# Patient Record
Sex: Female | Born: 1965
Health system: Southern US, Community
[De-identification: ages and names within clinical notes are randomized; demographics above are authoritative.]

## PROBLEM LIST (undated history)

## (undated) DIAGNOSIS — F419 Anxiety disorder, unspecified: Secondary | ICD-10-CM

## (undated) HISTORY — PX: TRIGGER FINGER RELEASE: SHX641

## (undated) HISTORY — PX: DILATION AND CURETTAGE OF UTERUS: SHX78

---

## 1999-04-22 ENCOUNTER — Other Ambulatory Visit: Admission: RE | Admit: 1999-04-22 | Discharge: 1999-04-22 | Payer: Self-pay | Admitting: Obstetrics and Gynecology

## 2000-04-22 ENCOUNTER — Other Ambulatory Visit: Admission: RE | Admit: 2000-04-22 | Discharge: 2000-04-22 | Payer: Self-pay | Admitting: Obstetrics and Gynecology

## 2001-09-06 ENCOUNTER — Other Ambulatory Visit: Admission: RE | Admit: 2001-09-06 | Discharge: 2001-09-06 | Payer: Self-pay | Admitting: Obstetrics and Gynecology

## 2003-02-06 ENCOUNTER — Ambulatory Visit (HOSPITAL_COMMUNITY): Admission: RE | Admit: 2003-02-06 | Discharge: 2003-02-06 | Payer: Self-pay | Admitting: Family Medicine

## 2003-02-06 ENCOUNTER — Encounter: Payer: Self-pay | Admitting: Family Medicine

## 2003-11-21 ENCOUNTER — Other Ambulatory Visit: Admission: RE | Admit: 2003-11-21 | Discharge: 2003-11-21 | Payer: Self-pay | Admitting: Obstetrics and Gynecology

## 2005-07-09 ENCOUNTER — Other Ambulatory Visit: Admission: RE | Admit: 2005-07-09 | Discharge: 2005-07-09 | Payer: Self-pay | Admitting: Obstetrics and Gynecology

## 2006-09-05 ENCOUNTER — Emergency Department (HOSPITAL_COMMUNITY): Admission: EM | Admit: 2006-09-05 | Discharge: 2006-09-05 | Payer: Self-pay | Admitting: Emergency Medicine

## 2018-09-08 DIAGNOSIS — H61002 Unspecified perichondritis of left external ear: Secondary | ICD-10-CM | POA: Diagnosis not present

## 2018-09-08 DIAGNOSIS — H9202 Otalgia, left ear: Secondary | ICD-10-CM | POA: Diagnosis not present

## 2018-09-08 DIAGNOSIS — H6692 Otitis media, unspecified, left ear: Secondary | ICD-10-CM | POA: Diagnosis not present

## 2018-11-06 DIAGNOSIS — Z6826 Body mass index (BMI) 26.0-26.9, adult: Secondary | ICD-10-CM | POA: Diagnosis not present

## 2018-11-06 DIAGNOSIS — Z1389 Encounter for screening for other disorder: Secondary | ICD-10-CM | POA: Diagnosis not present

## 2018-11-06 DIAGNOSIS — F418 Other specified anxiety disorders: Secondary | ICD-10-CM | POA: Diagnosis not present

## 2018-11-06 DIAGNOSIS — E663 Overweight: Secondary | ICD-10-CM | POA: Diagnosis not present

## 2018-11-06 DIAGNOSIS — Z0000 Encounter for general adult medical examination without abnormal findings: Secondary | ICD-10-CM | POA: Diagnosis not present

## 2018-11-06 DIAGNOSIS — R002 Palpitations: Secondary | ICD-10-CM | POA: Diagnosis not present

## 2018-11-06 DIAGNOSIS — Z719 Counseling, unspecified: Secondary | ICD-10-CM | POA: Diagnosis not present

## 2018-11-06 DIAGNOSIS — L570 Actinic keratosis: Secondary | ICD-10-CM | POA: Diagnosis not present

## 2018-11-16 ENCOUNTER — Encounter: Payer: Self-pay | Admitting: Internal Medicine

## 2018-11-24 ENCOUNTER — Encounter (INDEPENDENT_AMBULATORY_CARE_PROVIDER_SITE_OTHER): Payer: Self-pay | Admitting: *Deleted

## 2018-12-02 ENCOUNTER — Emergency Department (HOSPITAL_COMMUNITY)
Admission: EM | Admit: 2018-12-02 | Discharge: 2018-12-02 | Disposition: A | Discharge status: Home / Self Care | Payer: 59 | Attending: Emergency Medicine | Admitting: Emergency Medicine

## 2018-12-02 ENCOUNTER — Other Ambulatory Visit: Payer: Self-pay

## 2018-12-02 ENCOUNTER — Encounter (HOSPITAL_COMMUNITY): Payer: Self-pay | Admitting: Emergency Medicine

## 2018-12-02 ENCOUNTER — Emergency Department (HOSPITAL_COMMUNITY): Payer: 59

## 2018-12-02 DIAGNOSIS — F17210 Nicotine dependence, cigarettes, uncomplicated: Secondary | ICD-10-CM | POA: Insufficient documentation

## 2018-12-02 DIAGNOSIS — J189 Pneumonia, unspecified organism: Secondary | ICD-10-CM

## 2018-12-02 DIAGNOSIS — R05 Cough: Secondary | ICD-10-CM | POA: Diagnosis not present

## 2018-12-02 DIAGNOSIS — R0602 Shortness of breath: Secondary | ICD-10-CM | POA: Diagnosis not present

## 2018-12-02 DIAGNOSIS — J181 Lobar pneumonia, unspecified organism: Secondary | ICD-10-CM | POA: Insufficient documentation

## 2018-12-02 MED ORDER — ALBUTEROL SULFATE HFA 108 (90 BASE) MCG/ACT IN AERS
2.0000 | INHALATION_SPRAY | Freq: Once | RESPIRATORY_TRACT | Status: AC
  Administered 2018-12-02: 2 via RESPIRATORY_TRACT
  Filled 2018-12-02: qty 6.7

## 2018-12-02 MED ORDER — CEFTRIAXONE SODIUM 1 G IJ SOLR
1.0000 g | Freq: Once | INTRAMUSCULAR | Status: AC
  Administered 2018-12-02: 1 g via INTRAMUSCULAR
  Filled 2018-12-02: qty 10

## 2018-12-02 MED ORDER — AZITHROMYCIN 250 MG PO TABS: ORAL_TABLET | ORAL | 0 refills | Status: DC

## 2018-12-02 MED ORDER — LIDOCAINE HCL (PF) 1 % IJ SOLN
INTRAMUSCULAR | Status: AC
  Administered 2018-12-02: 2 mL
  Filled 2018-12-02: qty 2

## 2018-12-02 MED ORDER — PREDNISONE 20 MG PO TABS: 40.0000 mg | ORAL_TABLET | Freq: Every day | ORAL | 0 refills | Status: DC

## 2018-12-02 NOTE — Discharge Instructions (Addendum)
1 to 2 puffs of the inhaler every 4-6 hours as needed.  Take the antibiotic as directed until its finished.  You may start the Zithromax this evening.  Take over-the-counter Tylenol or ibuprofen if needed for body aches and/or fever.  Drink plenty of fluids.  Over-the-counter Mucinex or Robitussin as directed if needed for cough.  You will need to follow-up with your primary doctor for recheck in 1 week.  Return to the ER for any worsening symptoms.

## 2018-12-02 NOTE — ED Triage Notes (Signed)
Pt reports cough, generalized body aches, and subjective fever since Thursday. Has been having some improvement with OTC medications.

## 2018-12-05 NOTE — ED Provider Notes (Signed)
[facility] EMERGENCY DEPARTMENT Provider Note   CSN: [id number] Arrival date & time: 12/02/18  1442    History   Chief Complaint Chief Complaint  Patient presents with  . Cough    HPI Sheila Lawrence is a 53 y.o. female.     HPI   Sheila Lawrence is a 53 y.o. female who presents to the Emergency Department complaining of generalized body aches, fever, and cough.  Symptoms present for several days.  Cough has been mostly productive and occasional.  Fever at home is subjective.  She reports some improvement with OTC cold and cough medications. No shortness of breath, chest pain, abdominal pain, dysuria or vomiting.       History reviewed. No pertinent past medical history.  There are no active problems to display for this patient.   History reviewed. No pertinent surgical history.   OB History   No obstetric history on file.      Home Medications    Prior to Admission medications   Medication Sig Start Date End Date Taking? Authorizing Provider  azithromycin (ZITHROMAX) 250 MG tablet Take first 2 tablets together, then 1 every day until finished. 12/02/18   Judithe Keetch, PA-C  predniSONE (DELTASONE) 20 MG tablet Take 2 tablets (40 mg total) by mouth daily. 12/02/18   Kem Parkinson, PA-C    Family History History reviewed. No pertinent family history.  Social History Social History   Tobacco Use  . Smoking status: Current Every Day Smoker    Packs/day: 1.00    Types: Cigarettes  . Smokeless tobacco: Never Used  Substance Use Topics  . Alcohol use: Not Currently  . Drug use: Not Currently     Allergies   Patient has no known allergies.   Review of Systems Review of Systems  Constitutional: Positive for fever. Negative for activity change, appetite change and chills.  HENT: Positive for congestion and rhinorrhea. Negative for facial swelling, sore throat and trouble swallowing.   Eyes: Negative for visual disturbance.  Respiratory: Positive  for cough. Negative for chest tightness, shortness of breath, wheezing and stridor.   Gastrointestinal: Negative for abdominal pain, nausea and vomiting.  Genitourinary: Negative for decreased urine volume and dysuria.  Musculoskeletal: Positive for myalgias. Negative for neck pain and neck stiffness.  Skin: Negative for rash.  Neurological: Negative for dizziness, weakness, numbness and headaches.  Hematological: Negative for adenopathy.  Psychiatric/Behavioral: Negative for confusion.     Physical Exam Updated Vital Signs BP 110/79 (BP Location: Left Arm)   Pulse 98   Temp 99.1 F (37.3 C) (Oral)   Resp 16   Ht 5\' 6"  (1.676 m)   Wt 72.6 kg   SpO2 95%   BMI 25.82 kg/m   Physical Exam Vitals signs and nursing note reviewed.  Constitutional:      General: She is not in acute distress.    Appearance: She is well-developed.  HENT:     Head: Normocephalic and atraumatic.     Right Ear: Tympanic membrane and ear canal normal.     Left Ear: Tympanic membrane and ear canal normal.     Mouth/Throat:     Pharynx: Uvula midline. No oropharyngeal exudate.  Eyes:     Pupils: Pupils are equal, round, and reactive to light.  Neck:     Musculoskeletal: Full passive range of motion without pain and normal range of motion.     Trachea: Phonation normal.  Cardiovascular:     Rate and Rhythm: Normal rate  and regular rhythm.     Pulses: Normal pulses.     Heart sounds: No murmur.  Pulmonary:     Effort: Pulmonary effort is normal. No respiratory distress.     Breath sounds: No stridor. No wheezing or rales.     Comments: Expiratory wheezes, no rales Chest:     Chest wall: No tenderness.  Abdominal:     Palpations: Abdomen is soft.     Tenderness: There is no abdominal tenderness.  Musculoskeletal:        General: No tenderness.     Right lower leg: No edema.     Left lower leg: No edema.  Lymphadenopathy:     Cervical: No cervical adenopathy.  Skin:    General: Skin is warm  and dry.     Capillary Refill: Capillary refill takes less than 2 seconds.  Neurological:     Mental Status: She is alert. Mental status is at baseline.     Sensory: No sensory deficit.     Motor: No weakness or abnormal muscle tone.      ED Treatments / Results  Labs (all labs ordered are listed, but only abnormal results are displayed) Labs Reviewed - No data to display  EKG None  Radiology No results found.  Procedures Procedures (including critical care time)  Medications Ordered in ED Medications  cefTRIAXone (ROCEPHIN) injection 1 g (1 g Intramuscular Given 12/02/18 1703)  albuterol (PROVENTIL HFA;VENTOLIN HFA) 108 (90 Base) MCG/ACT inhaler 2 puff (2 puffs Inhalation Given 12/02/18 1700)  lidocaine (PF) (XYLOCAINE) 1 % injection (2 mLs  Given 12/02/18 1703)     Initial Impression / Assessment and Plan / ED Course  I have reviewed the triage vital signs and the nursing notes.  Pertinent labs & imaging results that were available during my care of the patient were reviewed by me and considered in my medical decision making (see chart for details).        Pt with cough for several days.  Vitals reassuring.  No hypoxia, tachycardia or tachypnea.  CXR shows RML PNA.   On recheck, lung sounds improved after albuterol neb, MDI dispensed.  IM rocephin also given and pt agrees to rx for zithromax and close out pt f/u to ensure resolution.  She appears appropriate for d/c home and strict return precautions discussed.   Final Clinical Impressions(s) / ED Diagnoses   Final diagnoses:  Community acquired pneumonia of right middle lobe of lung [facility])    ED Discharge Orders         Ordered    azithromycin (ZITHROMAX) 250 MG tablet     12/02/18 1708    predniSONE (DELTASONE) 20 MG tablet  Daily     12/02/18 1708           Kem Parkinson, PA-C 12/05/18 2318    Nat Christen, MD 12/06/18 [phone]

## 2019-01-23 ENCOUNTER — Encounter (INDEPENDENT_AMBULATORY_CARE_PROVIDER_SITE_OTHER): Payer: Self-pay | Admitting: *Deleted

## 2019-01-24 ENCOUNTER — Ambulatory Visit: Payer: Self-pay | Admitting: Nurse Practitioner

## 2019-06-04 ENCOUNTER — Other Ambulatory Visit (INDEPENDENT_AMBULATORY_CARE_PROVIDER_SITE_OTHER): Payer: Self-pay | Admitting: *Deleted

## 2019-06-04 DIAGNOSIS — Z1211 Encounter for screening for malignant neoplasm of colon: Secondary | ICD-10-CM | POA: Insufficient documentation

## 2019-07-17 ENCOUNTER — Telehealth (INDEPENDENT_AMBULATORY_CARE_PROVIDER_SITE_OTHER): Payer: Self-pay | Admitting: *Deleted

## 2019-07-17 ENCOUNTER — Encounter (INDEPENDENT_AMBULATORY_CARE_PROVIDER_SITE_OTHER): Payer: Self-pay | Admitting: *Deleted

## 2019-07-17 DIAGNOSIS — Z1211 Encounter for screening for malignant neoplasm of colon: Secondary | ICD-10-CM

## 2019-07-17 MED ORDER — SUPREP BOWEL PREP KIT 17.5-3.13-1.6 GM/177ML PO SOLN: 1.0000 | Freq: Once | ORAL | 0 refills | Status: AC

## 2019-07-17 NOTE — Telephone Encounter (Signed)
Patient needs suprep TCS sch'd 11/11

## 2019-07-25 ENCOUNTER — Ambulatory Visit (INDEPENDENT_AMBULATORY_CARE_PROVIDER_SITE_OTHER): Payer: Self-pay

## 2019-07-25 ENCOUNTER — Other Ambulatory Visit (INDEPENDENT_AMBULATORY_CARE_PROVIDER_SITE_OTHER): Payer: Self-pay | Admitting: *Deleted

## 2019-07-25 ENCOUNTER — Other Ambulatory Visit: Payer: Self-pay

## 2019-07-25 ENCOUNTER — Telehealth (INDEPENDENT_AMBULATORY_CARE_PROVIDER_SITE_OTHER): Payer: Self-pay | Admitting: *Deleted

## 2019-07-25 NOTE — Telephone Encounter (Signed)
Referring MD/PCP: golding   Procedure: tcs  Reason/Indication:  screening  Has patient had this procedure before?  no  If so, when, by whom and where?    Is there a family history of colon cancer?  no  Who?  What age when diagnosed?    Is patient diabetic?   no      Does patient have prosthetic heart valve or mechanical valve?  no  Do you have a pacemaker/defibrillator?  no  Has patient ever had endocarditis/atrial fibrillation? no  Does patient use oxygen? no  Has patient had joint replacement within last 12 months?  no  Is patient constipated or do they take laxatives? no  Does patient have a history of alcohol/drug use?  no  Is patient on blood thinner such as Coumadin, Plavix and/or Aspirin? no  Medications: rosuvstatin 5 mg daily  Allergies: nkda  Medication Adjustment per Dr Charlena Cross, NP:   Procedure date & time: 08/22/19 at 1030

## 2019-08-20 ENCOUNTER — Other Ambulatory Visit: Payer: Self-pay

## 2019-08-20 ENCOUNTER — Other Ambulatory Visit (HOSPITAL_COMMUNITY)
Admission: RE | Admit: 2019-08-20 | Discharge: 2019-08-20 | Disposition: A | Discharge status: Home / Self Care | Payer: 59 | Source: Ambulatory Visit | Attending: Internal Medicine | Admitting: Internal Medicine

## 2019-08-21 ENCOUNTER — Other Ambulatory Visit: Payer: Self-pay

## 2019-08-21 ENCOUNTER — Other Ambulatory Visit (HOSPITAL_COMMUNITY)
Admission: RE | Admit: 2019-08-21 | Discharge: 2019-08-21 | Disposition: A | Discharge status: Home / Self Care | Payer: 59 | Source: Ambulatory Visit | Attending: Internal Medicine | Admitting: Internal Medicine

## 2019-08-21 DIAGNOSIS — Z20828 Contact with and (suspected) exposure to other viral communicable diseases: Secondary | ICD-10-CM | POA: Diagnosis not present

## 2019-08-21 DIAGNOSIS — Z01812 Encounter for preprocedural laboratory examination: Secondary | ICD-10-CM | POA: Diagnosis not present

## 2019-08-21 LAB — SARS CORONAVIRUS 2 (TAT 6-24 HRS): SARS Coronavirus 2: NEGATIVE

## 2019-08-22 ENCOUNTER — Other Ambulatory Visit: Payer: Self-pay

## 2019-08-22 ENCOUNTER — Encounter (HOSPITAL_COMMUNITY): Payer: Self-pay | Admitting: *Deleted

## 2019-08-22 ENCOUNTER — Encounter (HOSPITAL_COMMUNITY)
Admission: RE | Disposition: A | Discharge status: Home / Self Care | Payer: Self-pay | Source: Home / Self Care | Attending: Internal Medicine

## 2019-08-22 ENCOUNTER — Ambulatory Visit (HOSPITAL_COMMUNITY)
Admission: RE | Admit: 2019-08-22 | Discharge: 2019-08-22 | Disposition: A | Discharge status: Home / Self Care | Payer: 59 | Attending: Internal Medicine | Admitting: Internal Medicine

## 2019-08-22 DIAGNOSIS — Z8371 Family history of colonic polyps: Secondary | ICD-10-CM | POA: Insufficient documentation

## 2019-08-22 DIAGNOSIS — Z1211 Encounter for screening for malignant neoplasm of colon: Secondary | ICD-10-CM | POA: Insufficient documentation

## 2019-08-22 DIAGNOSIS — F419 Anxiety disorder, unspecified: Secondary | ICD-10-CM | POA: Diagnosis not present

## 2019-08-22 DIAGNOSIS — Z79899 Other long term (current) drug therapy: Secondary | ICD-10-CM | POA: Diagnosis not present

## 2019-08-22 DIAGNOSIS — D128 Benign neoplasm of rectum: Secondary | ICD-10-CM | POA: Insufficient documentation

## 2019-08-22 DIAGNOSIS — F17210 Nicotine dependence, cigarettes, uncomplicated: Secondary | ICD-10-CM | POA: Insufficient documentation

## 2019-08-22 DIAGNOSIS — K621 Rectal polyp: Secondary | ICD-10-CM | POA: Diagnosis not present

## 2019-08-22 HISTORY — PX: POLYPECTOMY: SHX5525

## 2019-08-22 HISTORY — PX: COLONOSCOPY: SHX5424

## 2019-08-22 HISTORY — DX: Anxiety disorder, unspecified: F41.9

## 2019-08-22 SURGERY — COLONOSCOPY
Anesthesia: Moderate Sedation

## 2019-08-22 MED ORDER — MEPERIDINE HCL 50 MG/ML IJ SOLN
INTRAMUSCULAR | Status: DC | PRN
  Administered 2019-08-22 (×2): 25 mg

## 2019-08-22 MED ORDER — MIDAZOLAM HCL 5 MG/5ML IJ SOLN
INTRAMUSCULAR | Status: AC
  Filled 2019-08-22: qty 10

## 2019-08-22 MED ORDER — STERILE WATER FOR IRRIGATION IR SOLN
Status: DC | PRN
  Administered 2019-08-22: 11:00:00 1.5 mL

## 2019-08-22 MED ORDER — MEPERIDINE HCL 50 MG/ML IJ SOLN
INTRAMUSCULAR | Status: AC
  Filled 2019-08-22: qty 1

## 2019-08-22 MED ORDER — SODIUM CHLORIDE 0.9 % IV SOLN
INTRAVENOUS | Status: DC
  Administered 2019-08-22: 1000 mL via INTRAVENOUS

## 2019-08-22 MED ORDER — MIDAZOLAM HCL 5 MG/5ML IJ SOLN
INTRAMUSCULAR | Status: DC | PRN
  Administered 2019-08-22: 2 mg via INTRAVENOUS
  Administered 2019-08-22 (×2): 1 mg via INTRAVENOUS
  Administered 2019-08-22: 2 mg via INTRAVENOUS

## 2019-08-22 NOTE — Discharge Instructions (Signed)
No aspirin or NSAIDs for 48 hours. Resume usual medications and diet as before. No driving for 24 hours. Physician  will call with biopsy results.     Colonoscopy, Adult, Care After This sheet gives you information about how to care for yourself after your procedure. Your doctor may also give you more specific instructions. If you have problems or questions, call your doctor. What can I expect after the procedure? After the procedure, it is common to have:  A small amount of blood in your poop for 24 hours.  Some gas.  Mild cramping or bloating in your belly. Follow these instructions at home: General instructions  For the first 24 hours after the procedure: ? Do not drive or use machinery. ? Do not sign important documents. ? Do not drink alcohol. ? Do your daily activities more slowly than normal. ? Eat foods that are soft and easy to digest.  Take over-the-counter or prescription medicines only as told by your doctor. To help cramping and bloating:   Try walking around.  Put heat on your belly (abdomen) as told by your doctor. Use a heat source that your doctor recommends, such as a moist heat pack or a heating pad. ? Put a towel between your skin and the heat source. ? Leave the heat on for 20-30 minutes. ? Remove the heat if your skin turns bright red. This is especially important if you cannot feel pain, heat, or cold. You can get burned. Eating and drinking   Drink enough fluid to keep your pee (urine) clear or pale yellow.  Return to your normal diet as told by your doctor. Avoid heavy or fried foods that are hard to digest.  Avoid drinking alcohol for as long as told by your doctor. Contact a doctor if:  You have blood in your poop (stool) 2-3 days after the procedure. Get help right away if:  You have more than a small amount of blood in your poop.  You see large clumps of tissue (blood clots) in your poop.  Your belly is swollen.  You feel sick to  your stomach (nauseous).  You throw up (vomit).  You have a fever.  You have belly pain that gets worse, and medicine does not help your pain. Summary  After the procedure, it is common to have a small amount of blood in your poop. You may also have mild cramping and bloating in your belly.  For the first 24 hours after the procedure, do not drive or use machinery, do not sign important documents, and do not drink alcohol.  Get help right away if you have a lot of blood in your poop, feel sick to your stomach, have a fever, or have more belly pain. This information is not intended to replace advice given to you by your health care provider. Make sure you discuss any questions you have with your health care provider. Document Released: 10/30/2010 Document Revised: 07/28/2017 Document Reviewed: 06/21/2016 Elsevier Patient Education  2020 [employer].    Colon Polyps  Polyps are tissue growths inside the body. Polyps can grow in many places, including the large intestine (colon). A polyp may be a round bump or a mushroom-shaped growth. You could have one polyp or several. Most colon polyps are noncancerous (benign). However, some colon polyps can become cancerous over time. Finding and removing the polyps early can help prevent this. What are the causes? The exact cause of colon polyps is not known. What increases the  risk? You are more likely to develop this condition if you:  Have a family history of colon cancer or colon polyps.  Are older than 61 or older than 45 if you are African American.  Have inflammatory bowel disease, such as ulcerative colitis or Crohn's disease.  Have certain hereditary conditions, such as: ? Familial adenomatous polyposis. ? Lynch syndrome. ? Turcot syndrome. ? Peutz-Jeghers syndrome.  Are overweight.  Smoke cigarettes.  Do not get enough exercise.  Drink too much alcohol.  Eat a diet that is high in fat and red meat and low in  fiber.  Had childhood cancer that was treated with abdominal radiation. What are the signs or symptoms? Most polyps do not cause symptoms. If you have symptoms, they may include:  Blood coming from your rectum when having a bowel movement.  Blood in your stool. The stool may look dark red or black.  Abdominal pain.  A change in bowel habits, such as constipation or diarrhea. How is this diagnosed? This condition is diagnosed with a colonoscopy. This is a procedure in which a lighted, flexible scope is inserted into the anus and then passed into the colon to examine the area. Polyps are sometimes found when a colonoscopy is done as part of routine cancer screening tests. How is this treated? Treatment for this condition involves removing any polyps that are found. Most polyps can be removed during a colonoscopy. Those polyps will then be tested for cancer. Additional treatment may be needed depending on the results of testing. Follow these instructions at home: Lifestyle  Maintain a healthy weight, or lose weight if recommended by your health care provider.  Exercise every day or as told by your health care provider.  Do not use any products that contain nicotine or tobacco, such as cigarettes and e-cigarettes. If you need help quitting, ask your health care provider.  If you drink alcohol, limit how much you have: ? 0-1 drink a day for women. ? 0-2 drinks a day for men.  Be aware of how much alcohol is in your drink. In the [place] one drink equals one 12 oz bottle of beer (355 mL), one 5 oz glass of wine (148 mL), or one 1 oz shot of hard liquor (44 mL). Eating and drinking   Eat foods that are high in fiber, such as fruits, vegetables, and whole grains.  Eat foods that are high in calcium and vitamin D, such as milk, cheese, yogurt, eggs, liver, fish, and broccoli.  Limit foods that are high in fat, such as fried foods and desserts.  Limit the amount of red meat and  processed meat you eat, such as hot dogs, sausage, bacon, and lunch meats. General instructions  Keep all follow-up visits as told by your health care provider. This is important. ? This includes having regularly scheduled colonoscopies. ? Talk to your health care provider about when you need a colonoscopy. Contact a health care provider if:  You have new or worsening bleeding during a bowel movement.  You have new or increased blood in your stool.  You have a change in bowel habits.  You lose weight for no known reason. Summary  Polyps are tissue growths inside the body. Polyps can grow in many places, including the colon.  Most colon polyps are noncancerous (benign), but some can become cancerous over time.  This condition is diagnosed with a colonoscopy.  Treatment for this condition involves removing any polyps that are found. Most polyps can  be removed during a colonoscopy. This information is not intended to replace advice given to you by your health care provider. Make sure you discuss any questions you have with your health care provider. Document Released: 06/23/2004 Document Revised: 01/12/2018 Document Reviewed: 01/12/2018 Elsevier Patient Education  2020 [employer].

## 2019-08-22 NOTE — Op Note (Signed)
[facility] Patient Name: Sheila Lawrence Procedure Date: 08/22/2019 10:28 AM MRN: [id number] Date of Birth: 09/14/1966 Attending MD: Hildred Laser , MD CSN: [id number] Age: 53 Admit Type: Outpatient Procedure:                Colonoscopy Indications:              Screening for colorectal malignant neoplasm Providers:                Hildred Laser, MD, Gerome Sam, RN, Randa Spike, Technician Referring MD:             Halford Chessman, MD Medicines:                Meperidine 50 mg IV, Midazolam 5 mg IV Complications:            No immediate complications. Estimated Blood Loss:     Estimated blood loss was minimal. Procedure:                Pre-Anesthesia Assessment:                           - Prior to the procedure, a History and Physical                            was performed, and patient medications and                            allergies were reviewed. The patient's tolerance of                            previous anesthesia was also reviewed. The risks                            and benefits of the procedure and the sedation                            options and risks were discussed with the patient.                            All questions were answered, and informed consent                            was obtained. Prior Anticoagulants: The patient has                            taken no previous anticoagulant or antiplatelet                            agents. ASA Grade Assessment: I - A normal, healthy                            patient. After reviewing the risks and benefits,  the patient was deemed in satisfactory condition to                            undergo the procedure.                           After obtaining informed consent, the colonoscope                            was passed under direct vision. Throughout the                            procedure, the patient's blood pressure, pulse, and                   oxygen saturations were monitored continuously. The                            [id number]) was introduced through the                            anus and advanced to the the cecum, identified by                            appendiceal orifice and ileocecal valve. The                            colonoscopy was performed without difficulty. The                            patient tolerated the procedure well. The quality                            of the bowel preparation was good. The ileocecal                            valve, appendiceal orifice, and rectum were                            photographed. Scope In: 10:48:14 AM Scope Out: [id number] AM Scope Withdrawal Time: 0 hours 14 minutes 8 seconds  Total Procedure Duration: 0 hours 26 minutes 54 seconds  Findings:      The perianal and digital rectal examinations were normal.      The colon (entire examined portion) appeared normal.      A 5 mm polyp was found in the rectum. The polyp was removed with a cold       snare. Resection and retrieval were complete. The pathology specimen was       placed into Bottle Number 1.      A small polyp was found in the rectum. The polyp was sessile. The polyp       was removed with a cold snare. Resection and retrieval were complete.       The pathology specimen was placed into Bottle Number 1. Impression:               - The entire examined colon  is normal.                           - One 5 mm polyp in the rectum, removed with a cold                            snare. Resected and retrieved.                           - One small polyp in the rectum, removed with a                            cold snare. Resected and retrieved. Moderate Sedation:      Moderate (conscious) sedation was administered by the endoscopy nurse       and supervised by the endoscopist. The following parameters were       monitored: oxygen saturation, heart rate, blood pressure, CO2       capnography and  response to care. Total physician intraservice time was       33 minutes. Recommendation:           - Patient has a contact number available for                            emergencies. The signs and symptoms of potential                            delayed complications were discussed with the                            patient. Return to normal activities tomorrow.                            Written discharge instructions were provided to the                            patient.                           - Resume previous diet today.                           - Continue present medications.                           - No aspirin, ibuprofen, naproxen, or other                            non-steroidal anti-inflammatory drugs for 2 days.                           - Await pathology results.                           - Repeat colonoscopy is recommended. The  colonoscopy date will be determined after pathology                            results from today's exam become available for                            review. Procedure Code(s):        --- Professional ---                           [id number], Colonoscopy, flexible; with removal of                            tumor(s), polyp(s), or other lesion(s) by snare                            technique                           99153, Moderate sedation; each additional 15                            minutes intraservice time                           G0500, Moderate sedation services provided by the                            same physician or other qualified health care                            professional performing a gastrointestinal                            endoscopic service that sedation supports,                            requiring the presence of an independent trained                            observer to assist in the monitoring of the                            patient's level of consciousness and physiological                             status; initial 15 minutes of intra-service time;                            patient age 68 years or older (additional time may                            be reported with [phone], as appropriate) Diagnosis Code(s):        --- Professional ---  Z12.11, Encounter for screening for malignant                            neoplasm of colon                           K62.1, Rectal polyp CPT copyright 2019 [employer] Association. All rights reserved. The codes documented in this report are preliminary and upon coder review may  be revised to meet current compliance requirements. Hildred Laser, MD Hildred Laser, MD 08/22/2019 11:21:51 AM This report has been signed electronically. Number of Addenda: 0

## 2019-08-22 NOTE — H&P (Signed)
Sheila Lawrence is an 53 y.o. female.   Chief Complaint: Patient is here for colonoscopy. HPI: Patient is 53 year old Caucasian female who is here for screening colonoscopy.  She denies abdominal pain change in bowel habits or rectal bleeding.  This is patient's first exam. Patient states her father had a polyp removed from his colon at age 15 possibly with high-grade dysplasia.  He did not require any surgery.  He is now 53 years old and doing fine.  Past Medical History:  Diagnosis Date  . Anxiety    panic attacks    Past Surgical History:  Procedure Laterality Date  . DILATION AND CURETTAGE OF UTERUS    . TRIGGER FINGER RELEASE Right     Family History  Problem Relation Age of Onset  . Asthma Mother    Social History:  reports that she has been smoking cigarettes. She has been smoking about 1.00 pack per day. She has never used smokeless tobacco. She reports previous alcohol use. She reports previous drug use.  Allergies: No Known Allergies  Medications Prior to Admission  Medication Sig Dispense Refill  . Cholecalciferol (VITAMIN D3 PO) Take 1 tablet by mouth daily.    Marland Kitchen azithromycin (ZITHROMAX) 250 MG tablet Take first 2 tablets together, then 1 every day until finished. (Patient not taking: Reported on 08/14/2019) 6 tablet 0  . predniSONE (DELTASONE) 20 MG tablet Take 2 tablets (40 mg total) by mouth daily. (Patient not taking: Reported on 08/14/2019) 10 tablet 0    Results for orders placed or performed during the hospital encounter of 08/21/19 (from the past 48 hour(s))  SARS CORONAVIRUS 2 (TAT 6-24 HRS) Nasopharyngeal Nasopharyngeal Swab     Status: None   Collection Time: 08/21/19  7:15 AM   Specimen: Nasopharyngeal Swab  Result Value Ref Range   SARS Coronavirus 2 NEGATIVE NEGATIVE    Comment: (NOTE) SARS-CoV-2 target nucleic acids are NOT DETECTED. The SARS-CoV-2 RNA is generally detectable in upper and lower respiratory specimens during the acute phase of  infection. Negative results do not preclude SARS-CoV-2 infection, do not rule out co-infections with other pathogens, and should not be used as the sole basis for treatment or other patient management decisions. Negative results must be combined with clinical observations, patient history, and epidemiological information. The expected result is Negative. Fact Sheet for Patients: SugarRoll.be Fact Sheet for Healthcare Providers: https://www.woods-mathews.com/ This test is not yet approved or cleared by the [place] FDA and  has been authorized for detection and/or diagnosis of SARS-CoV-2 by FDA under an Emergency Use Authorization (EUA). This EUA will remain  in effect (meaning this test can be used) for the duration of the COVID-19 declaration under Section 56 4(b)(1) of the Act, 21 U.S.C. section 360bbb-3(b)(1), unless the authorization is terminated or revoked sooner. Performed at [facility], [address] [place], [place] [address]    No results found.  ROS  Blood pressure 117/79, pulse 88, temperature (!) 97 F (36.1 C), temperature source Axillary, resp. rate 14, height 5\' 6"  (1.676 m), weight 72.6 kg, SpO2 99 %. Physical Exam  Constitutional: She appears well-developed and well-nourished.  HENT:  Mouth/Throat: Oropharynx is clear and moist.  Eyes: Conjunctivae are normal. No scleral icterus.  Neck: No thyromegaly present.  Cardiovascular: Normal rate, regular rhythm and normal heart sounds.  No murmur heard. Respiratory: Effort normal and breath sounds normal.  GI: Soft. She exhibits no distension and no mass. There is no abdominal tenderness.  Musculoskeletal:  General: No edema.  Lymphadenopathy:    She has no cervical adenopathy.  Neurological: She is alert.  Skin: Skin is warm and dry.     Assessment/Plan Average risk screening colonoscopy.  Hildred Laser, MD 08/22/2019, 10:38 AM

## 2019-08-23 LAB — SURGICAL PATHOLOGY

## 2019-08-27 ENCOUNTER — Telehealth (INDEPENDENT_AMBULATORY_CARE_PROVIDER_SITE_OTHER): Payer: Self-pay | Admitting: Internal Medicine

## 2019-08-27 ENCOUNTER — Encounter (HOSPITAL_COMMUNITY): Payer: Self-pay | Admitting: Internal Medicine

## 2019-08-27 NOTE — Telephone Encounter (Signed)
Patient called stated she had a procedure done last week - has been having diarrhea, nausea & vomiting - please advise ph# [phone]

## 2019-08-28 NOTE — Telephone Encounter (Signed)
Talked with the patient. She states that after her procedure on 08/22/2019 she had had a hamburger She has experienced a lot of diarrhea , nausea, and vomiting. Thursday after the procedure she made a taco soup , had a little diarrhea. Friday she had cramping and a little diarrhea. Saturday she made a pot roast in the crock pot , it had mushroom gravy , onions  ect in it. She got real sick with a lot of diarrhea. Saturday night she had nausea and vomiting, in which she describes a big clob of pot roast, the diarrhea continued to be bad. On yesterday she only ate 2 rolls  (bread) slept well . This morning she had a explosive watery stool but feels better. She also states that she has had burping and burning.  Per Dr.Rehman the patient may take Imodium 1 mg up to 3 per day. If she continues to have loose stool we do stool pathogen. She should have a low residue diet today, to include no raw veggies , or fruits. May have chicken , fish, [place].  Patient was also given her pathology results;  Patient was called and made aware.

## 2019-12-20 DIAGNOSIS — D225 Melanocytic nevi of trunk: Secondary | ICD-10-CM | POA: Diagnosis not present

## 2019-12-20 DIAGNOSIS — L570 Actinic keratosis: Secondary | ICD-10-CM | POA: Diagnosis not present

## 2019-12-20 DIAGNOSIS — L821 Other seborrheic keratosis: Secondary | ICD-10-CM | POA: Diagnosis not present

## 2019-12-20 DIAGNOSIS — L820 Inflamed seborrheic keratosis: Secondary | ICD-10-CM | POA: Diagnosis not present

## 2019-12-20 DIAGNOSIS — X32XXXA Exposure to sunlight, initial encounter: Secondary | ICD-10-CM | POA: Diagnosis not present

## 2020-07-09 ENCOUNTER — Other Ambulatory Visit: Payer: Self-pay

## 2020-07-09 ENCOUNTER — Other Ambulatory Visit: Payer: 59

## 2020-07-09 DIAGNOSIS — Z20822 Contact with and (suspected) exposure to covid-19: Secondary | ICD-10-CM

## 2020-07-10 LAB — NOVEL CORONAVIRUS, NAA: SARS-CoV-2, NAA: NOT DETECTED

## 2020-07-10 LAB — SARS-COV-2, NAA 2 DAY TAT

## 2020-07-28 DIAGNOSIS — Z1231 Encounter for screening mammogram for malignant neoplasm of breast: Secondary | ICD-10-CM | POA: Diagnosis not present

## 2020-07-28 DIAGNOSIS — Z01419 Encounter for gynecological examination (general) (routine) without abnormal findings: Secondary | ICD-10-CM | POA: Diagnosis not present

## 2020-07-28 DIAGNOSIS — E663 Overweight: Secondary | ICD-10-CM | POA: Diagnosis not present

## 2020-07-28 DIAGNOSIS — Z2821 Immunization not carried out because of patient refusal: Secondary | ICD-10-CM | POA: Diagnosis not present

## 2020-11-06 DIAGNOSIS — E7849 Other hyperlipidemia: Secondary | ICD-10-CM | POA: Diagnosis not present

## 2020-11-06 DIAGNOSIS — Z1331 Encounter for screening for depression: Secondary | ICD-10-CM | POA: Diagnosis not present

## 2020-11-06 DIAGNOSIS — F329 Major depressive disorder, single episode, unspecified: Secondary | ICD-10-CM | POA: Diagnosis not present

## 2020-11-06 DIAGNOSIS — Z6826 Body mass index (BMI) 26.0-26.9, adult: Secondary | ICD-10-CM | POA: Diagnosis not present

## 2020-11-06 DIAGNOSIS — R002 Palpitations: Secondary | ICD-10-CM | POA: Diagnosis not present

## 2020-11-06 DIAGNOSIS — Z1389 Encounter for screening for other disorder: Secondary | ICD-10-CM | POA: Diagnosis not present

## 2020-11-06 DIAGNOSIS — F418 Other specified anxiety disorders: Secondary | ICD-10-CM | POA: Diagnosis not present

## 2020-11-06 DIAGNOSIS — Z0001 Encounter for general adult medical examination with abnormal findings: Secondary | ICD-10-CM | POA: Diagnosis not present

## 2020-12-15 DIAGNOSIS — H538 Other visual disturbances: Secondary | ICD-10-CM | POA: Diagnosis not present

## 2020-12-15 DIAGNOSIS — F418 Other specified anxiety disorders: Secondary | ICD-10-CM | POA: Diagnosis not present

## 2020-12-15 DIAGNOSIS — H539 Unspecified visual disturbance: Secondary | ICD-10-CM | POA: Diagnosis not present

## 2020-12-15 DIAGNOSIS — Z6826 Body mass index (BMI) 26.0-26.9, adult: Secondary | ICD-10-CM | POA: Diagnosis not present

## 2020-12-15 DIAGNOSIS — R009 Unspecified abnormalities of heart beat: Secondary | ICD-10-CM | POA: Diagnosis not present

## 2020-12-16 ENCOUNTER — Other Ambulatory Visit: Payer: Self-pay | Admitting: Physician Assistant

## 2020-12-16 ENCOUNTER — Other Ambulatory Visit (HOSPITAL_COMMUNITY): Payer: Self-pay | Admitting: Physician Assistant

## 2020-12-16 DIAGNOSIS — R009 Unspecified abnormalities of heart beat: Secondary | ICD-10-CM

## 2020-12-16 DIAGNOSIS — Z6826 Body mass index (BMI) 26.0-26.9, adult: Secondary | ICD-10-CM

## 2020-12-16 DIAGNOSIS — H539 Unspecified visual disturbance: Secondary | ICD-10-CM

## 2020-12-16 DIAGNOSIS — H538 Other visual disturbances: Secondary | ICD-10-CM

## 2020-12-25 ENCOUNTER — Ambulatory Visit (HOSPITAL_COMMUNITY): Payer: 59

## 2020-12-25 ENCOUNTER — Encounter (HOSPITAL_COMMUNITY): Payer: Self-pay

## 2021-04-06 ENCOUNTER — Ambulatory Visit (HOSPITAL_COMMUNITY)
Admission: RE | Admit: 2021-04-06 | Discharge: 2021-04-06 | Disposition: A | Discharge status: Home / Self Care | Payer: 59 | Source: Ambulatory Visit | Attending: Physician Assistant | Admitting: Physician Assistant

## 2021-04-06 ENCOUNTER — Other Ambulatory Visit: Payer: Self-pay

## 2021-04-06 DIAGNOSIS — H539 Unspecified visual disturbance: Secondary | ICD-10-CM | POA: Diagnosis not present

## 2021-04-06 DIAGNOSIS — R009 Unspecified abnormalities of heart beat: Secondary | ICD-10-CM | POA: Diagnosis not present

## 2021-04-06 DIAGNOSIS — H538 Other visual disturbances: Secondary | ICD-10-CM | POA: Diagnosis not present

## 2021-04-06 DIAGNOSIS — R519 Headache, unspecified: Secondary | ICD-10-CM | POA: Diagnosis not present

## 2021-04-06 DIAGNOSIS — Z6826 Body mass index (BMI) 26.0-26.9, adult: Secondary | ICD-10-CM | POA: Insufficient documentation

## 2021-04-28 ENCOUNTER — Ambulatory Visit (INDEPENDENT_AMBULATORY_CARE_PROVIDER_SITE_OTHER): Payer: 59 | Admitting: Cardiology

## 2021-04-28 ENCOUNTER — Other Ambulatory Visit: Payer: Self-pay

## 2021-04-28 ENCOUNTER — Encounter: Payer: Self-pay | Admitting: Cardiology

## 2021-04-28 VITALS — BP 124/80 | HR 82 | Ht 66.0 in | Wt 157.6 lb

## 2021-04-28 DIAGNOSIS — R002 Palpitations: Secondary | ICD-10-CM

## 2021-04-28 NOTE — Patient Instructions (Signed)
Medication Instructions:  Continue all current medications.   Labwork: none  Testing/Procedures: none  Follow-Up: 6 months   Any Other Special Instructions Will Be Listed Below (If Applicable).   If you need a refill on your cardiac medications before your next appointment, please call your pharmacy.  

## 2021-04-28 NOTE — Progress Notes (Signed)
Clinical Summary [name] is a 55 y.o.female seen today as a new consult, referred by Dr Hilma Favors for the following medical problems.  1.Palpitations - long history of palpitations, often worst with stress - more recently symptoms have changed. Feeling of heart skipping and pausing, would check pulse and notices irregular rhythm - symptosm are sporadic, last episode over 3 weeks ago. Had been going on more often, could last all day - at times could feel ligtheaded, though infrequent. - no coffee, sun drops cans/bottles total of 3 per day or more, no energy drinks    -can have separate episodes of slight headache, blurry vision, confusion that suddenyl comes on. Longest episode lasted about 45 minutes.   SH: works as Garment/textile technologist at [employer]  Past Medical History:  Diagnosis Date   Anxiety    panic attacks     No Known Allergies   Current Outpatient Medications  Medication Sig Dispense Refill   Cholecalciferol (VITAMIN D3 PO) Take 1 tablet by mouth daily.     No current facility-administered medications for this visit.     Past Surgical History:  Procedure Laterality Date   COLONOSCOPY N/A 08/22/2019   Procedure: COLONOSCOPY;  Surgeon: Rogene Houston, MD;  [facility];  Service: Endoscopy;  Laterality: N/A;  [place]     POLYPECTOMY  08/22/2019   Procedure: POLYPECTOMY;  Surgeon: Rogene Houston, MD;  Location[facility];  Service: Endoscopy;;  rectum    TRIGGER FINGER RELEASE Right      No Known Allergies    Family History  Problem Relation Age of Onset   Asthma Mother      Social History [name] reports that she has been smoking cigarettes. She has been smoking an average of 1 pack per day. She has never used smokeless tobacco. [name] reports previous alcohol use.   Review of Systems CONSTITUTIONAL: No weight loss, fever, chills, weakness or fatigue.  HEENT: Eyes: No visual loss,  blurred vision, double vision or yellow sclerae.No hearing loss, sneezing, congestion, runny nose or sore throat.  SKIN: No rash or itching.  CARDIOVASCULAR: per hpi RESPIRATORY: No shortness of breath, cough or sputum.  GASTROINTESTINAL: No anorexia, nausea, vomiting or diarrhea. No abdominal pain or blood.  GENITOURINARY: No burning on urination, no polyuria NEUROLOGICAL: per hpi MUSCULOSKELETAL: No muscle, back pain, joint pain or stiffness.  LYMPHATICS: No enlarged nodes. No history of splenectomy.  PSYCHIATRIC: No history of depression or anxiety.  ENDOCRINOLOGIC: No reports of sweating, cold or heat intolerance. No polyuria or polydipsia.  Marland Kitchen   Physical Examination Today's Vitals   04/28/21 1455  BP: 124/80  Pulse: 82  SpO2: 98%  Weight: 157 lb 9.6 oz (71.5 kg)  Height: 5\' 6"  (1.676 m)   Body mass index is 25.44 kg/m.  Gen: resting comfortably, no acute distress HEENT: no scleral icterus, pupils equal round and reactive, no palptable cervical adenopathy,  CV: RRR, no m/r/g, no jvd Resp: Clear to auscultation bilaterally GI: abdomen is soft, non-tender, non-distended, normal bowel sounds, no hepatosplenomegaly MSK: extremities are warm, no edema.  Skin: warm, no rash Neuro:  no focal deficits Psych: appropriate affect    Assessment and Plan  Palpitations - recently have quieted down, if reoccur would plan for home event monitor - her transient episodes of bilateral blurry vision, confusion, headache do not fit with a symptoamtic arrhythmia, would recommend proceeding with a neuro evaluation for  those symptoms.. - EKG today shows NSR       [name], M.D.

## 2021-08-11 ENCOUNTER — Encounter (HOSPITAL_COMMUNITY): Payer: Self-pay | Admitting: *Deleted

## 2021-08-11 ENCOUNTER — Emergency Department (HOSPITAL_COMMUNITY): Payer: 59

## 2021-08-11 ENCOUNTER — Emergency Department (HOSPITAL_COMMUNITY)
Admission: EM | Admit: 2021-08-11 | Discharge: 2021-08-11 | Disposition: A | Discharge status: Home / Self Care | Payer: 59 | Attending: Emergency Medicine | Admitting: Emergency Medicine

## 2021-08-11 DIAGNOSIS — R009 Unspecified abnormalities of heart beat: Secondary | ICD-10-CM | POA: Diagnosis not present

## 2021-08-11 DIAGNOSIS — Z5321 Procedure and treatment not carried out due to patient leaving prior to being seen by health care provider: Secondary | ICD-10-CM | POA: Diagnosis not present

## 2021-08-11 DIAGNOSIS — I499 Cardiac arrhythmia, unspecified: Secondary | ICD-10-CM | POA: Diagnosis not present

## 2021-08-11 LAB — BASIC METABOLIC PANEL
Anion gap: 7 (ref 5–15)
BUN: 9 mg/dL (ref 6–20)
CO2: 27 mmol/L (ref 22–32)
Calcium: 9.3 mg/dL (ref 8.9–10.3)
Chloride: 104 mmol/L (ref 98–111)
Creatinine, Ser: 0.84 mg/dL (ref 0.44–1.00)
GFR, Estimated: >60 mL/min (ref >60)
Glucose, Bld: 98 mg/dL (ref 70–99)
Potassium: 2.9 mmol/L — ABNORMAL LOW (ref 3.5–5.1)
Sodium: 138 mmol/L (ref 135–145)

## 2021-08-11 LAB — CBC
HCT: 42.7 % (ref 36.0–46.0)
Hemoglobin: 14.5 g/dL (ref 12.0–15.0)
MCH: 32.2 pg (ref 26.0–34.0)
MCHC: 34 g/dL (ref 30.0–36.0)
MCV: 94.9 fL (ref 80.0–100.0)
Platelets: 294 10*3/uL (ref 150–400)
RBC: 4.5 MIL/uL (ref 3.87–5.11)
RDW: 12.4 % (ref 11.5–15.5)
WBC: 7.1 10*3/uL (ref 4.0–10.5)
nRBC: 0 % (ref 0.0–0.2)

## 2021-08-11 LAB — TROPONIN I (HIGH SENSITIVITY): Troponin I (High Sensitivity): <2 ng/L (ref <18)

## 2021-08-11 NOTE — ED Triage Notes (Signed)
Irregular heart rate today

## 2021-08-11 NOTE — ED Notes (Signed)
Called to take pt to treatment room, per registration she left at 2013

## 2021-08-12 ENCOUNTER — Telehealth: Payer: Self-pay | Admitting: Cardiology

## 2021-08-12 MED ORDER — POTASSIUM CHLORIDE CRYS ER 10 MEQ PO TBCR: 40.0000 meq | EXTENDED_RELEASE_TABLET | Freq: Every day | ORAL | 0 refills | Status: AC

## 2021-08-12 NOTE — Telephone Encounter (Signed)
Fwd to provider for review.

## 2021-08-12 NOTE — Telephone Encounter (Signed)
Patient notified and verbalized understanding.  Will send Potassium to [facility] now.    Patient states that she would like to do the monitor as well, regardless of the issue with the Potassium.  Stated that she was having the same symptoms prior to this when her labs were normal.    Message sent to provider for approval & if so, how many days ?

## 2021-08-12 NOTE — Telephone Encounter (Signed)
Patient c/o Palpitations:  High priority if patient c/o lightheadedness, shortness of breath, or chest pain  How long have you had palpitations/irregular HR/ Afib? Are you having the symptoms now? Started about a week ago on and off   Are you currently experiencing lightheadedness, SOB or CP? No   Do you have a history of afib (atrial fibrillation) or irregular heart rhythm? Yes  Have you checked your BP or HR? (document readings if available): last night at hospital 150/90 HR 90's   Are you experiencing any other symptoms?  No  States Dr. Harl Bowie advised her to call next time this occurred and they will get her a heart monitor.

## 2021-08-12 NOTE — Telephone Encounter (Signed)
Her potassium was very low in the ER, which can trigger abnormal heart rhythm. Has she been having any vomiting or diarrhea that may have caused her potassium to drop. Was she given any potassium in the ER? I think she may have left before receiving anything. If did not get any potassium start KCl 79meQ daily x 4 days. Would need to see if heart rates resolve alone with potassium replacement, if so would not need a monitor  Zandra Abts MD

## 2021-08-13 NOTE — Telephone Encounter (Signed)
Can do monitor but would wait until she has finished her potassium replacement. Can do 14 day zio patch for palpitations, start next week   J Haik Mahoney MD

## 2021-08-13 NOTE — Telephone Encounter (Signed)
Patient notified and verbalized understanding. Patient states that she is feeling a lot better today.  States that she will hold off on monitor for now & call back if she changes her mind.

## 2021-08-14 DIAGNOSIS — E876 Hypokalemia: Secondary | ICD-10-CM | POA: Diagnosis not present

## 2021-08-14 DIAGNOSIS — R002 Palpitations: Secondary | ICD-10-CM | POA: Diagnosis not present

## 2021-08-14 DIAGNOSIS — E663 Overweight: Secondary | ICD-10-CM | POA: Diagnosis not present

## 2021-08-14 DIAGNOSIS — Z6825 Body mass index (BMI) 25.0-25.9, adult: Secondary | ICD-10-CM | POA: Diagnosis not present

## 2021-08-19 DIAGNOSIS — E876 Hypokalemia: Secondary | ICD-10-CM | POA: Diagnosis not present

## 2021-09-28 ENCOUNTER — Emergency Department (HOSPITAL_COMMUNITY): Payer: 59

## 2021-09-28 ENCOUNTER — Emergency Department (HOSPITAL_COMMUNITY)
Admission: EM | Admit: 2021-09-28 | Discharge: 2021-09-28 | Disposition: A | Discharge status: Home / Self Care | Payer: 59 | Attending: Emergency Medicine | Admitting: Emergency Medicine

## 2021-09-28 ENCOUNTER — Encounter (HOSPITAL_COMMUNITY): Payer: Self-pay

## 2021-09-28 ENCOUNTER — Other Ambulatory Visit: Payer: Self-pay

## 2021-09-28 DIAGNOSIS — R002 Palpitations: Secondary | ICD-10-CM | POA: Diagnosis not present

## 2021-09-28 DIAGNOSIS — F17210 Nicotine dependence, cigarettes, uncomplicated: Secondary | ICD-10-CM | POA: Diagnosis not present

## 2021-09-28 LAB — BASIC METABOLIC PANEL
Anion gap: 8 (ref 5–15)
BUN: 7 mg/dL (ref 6–20)
CO2: 26 mmol/L (ref 22–32)
Calcium: 9.5 mg/dL (ref 8.9–10.3)
Chloride: 104 mmol/L (ref 98–111)
Creatinine, Ser: 0.57 mg/dL (ref 0.44–1.00)
GFR, Estimated: >60 mL/min (ref >60)
Glucose, Bld: 109 mg/dL — ABNORMAL HIGH (ref 70–99)
Potassium: 4.1 mmol/L (ref 3.5–5.1)
Sodium: 138 mmol/L (ref 135–145)

## 2021-09-28 LAB — CBC
HCT: 45.3 % (ref 36.0–46.0)
Hemoglobin: 15.1 g/dL — ABNORMAL HIGH (ref 12.0–15.0)
MCH: 31.4 pg (ref 26.0–34.0)
MCHC: 33.3 g/dL (ref 30.0–36.0)
MCV: 94.2 fL (ref 80.0–100.0)
Platelets: 330 10*3/uL (ref 150–400)
RBC: 4.81 MIL/uL (ref 3.87–5.11)
RDW: 12.5 % (ref 11.5–15.5)
WBC: 9.5 10*3/uL (ref 4.0–10.5)
nRBC: 0 % (ref 0.0–0.2)

## 2021-09-28 LAB — POC URINE PREG, ED: Preg Test, Ur: NEGATIVE

## 2021-09-28 LAB — TROPONIN I (HIGH SENSITIVITY)
Troponin I (High Sensitivity): <2 ng/L (ref <18)
Troponin I (High Sensitivity): 2 ng/L (ref <18)

## 2021-09-28 NOTE — ED Provider Notes (Signed)
[facility] EMERGENCY DEPARTMENT Provider Note   CSN: [id number] Arrival date & time: 09/28/21  1637     History Chief Complaint  Patient presents with   Palpitations    Sheila Lawrence is a 55 y.o. female with past medical history significant for anxiety who presents for evaluation of abnormal heart rhythm.  Patient states she has felt like she has had "long pauses" in her heart rate today.  Had something similar 1 month ago.  Initially thought was due to hypokalemia.  She has been supplementing with potassium.  She has no chest pain, shortness of breath, lower extremity swelling.  Symptoms resolved prior to my evaluation.  No prior history of documented arrhythmia, PVC.  She is not anticoagulated.  Denies additional aggravating or alleviating factors.  No fever, chills, nausea, vomiting, shortness of breath, chest pain, back pain, paresthesias or weakness. No syncope, near syncope.  History obtained from patient and past medical records.  No interpreter is used.  HPI     Past Medical History:  Diagnosis Date   Anxiety    panic attacks    Patient Active Problem List   Diagnosis Date Noted   Special screening for malignant neoplasms, colon 06/04/2019    Past Surgical History:  Procedure Laterality Date   COLONOSCOPY N/A 08/22/2019   Procedure: COLONOSCOPY;  Surgeon: Rogene Houston, MD;  [facility];  Service: Endoscopy;  Laterality: N/A;  [place]     POLYPECTOMY  08/22/2019   Procedure: POLYPECTOMY;  Surgeon: Rogene Houston, MD;  [facility];  Service: Endoscopy;;  rectum    TRIGGER FINGER RELEASE Right      OB History   No obstetric history on file.     Family History  Problem Relation Age of Onset   Asthma Mother     Social History   Tobacco Use   Smoking status: Every Day    Packs/day: 1.00    Types: Cigarettes   Smokeless tobacco: Never  Vaping Use   Vaping Use: Never used  Substance Use  Topics   Alcohol use: Not Currently   Drug use: Not Currently    Home Medications Prior to Admission medications   Medication Sig Start Date End Date Taking? Authorizing Provider  Krill Oil 1000 MG CAPS Take 1 capsule by mouth daily at 6 (six) AM.    [provider]  potassium chloride (KLOR-CON) 10 MEQ tablet Take 4 tablets (40 mEq total) by mouth daily. 08/12/21   [name], MD  rosuvastatin (CRESTOR) 5 MG tablet Take 5 mg by mouth daily. 11/10/20   [provider]    Allergies    Patient has no known allergies.  Review of Systems   Review of Systems  Constitutional: Negative.   HENT: Negative.    Respiratory: Negative.    Cardiovascular:  Positive for palpitations. Negative for chest pain and leg swelling.  Gastrointestinal: Negative.   Genitourinary: Negative.   Musculoskeletal: Negative.   Skin: Negative.   Neurological: Negative.   All other systems reviewed and are negative.  Physical Exam Updated Vital Signs BP 101/86    Pulse 83    Temp 97.8 F (36.6 C) (Oral)    Resp 19    Ht 5\' 7"  (1.702 m)    Wt 69.4 kg    SpO2 100%    BMI 23.96 kg/m   Physical Exam Vitals and nursing note reviewed.  Constitutional:  General: She is not in acute distress.    Appearance: She is well-developed. She is not ill-appearing, toxic-appearing or diaphoretic.  HENT:     Head: Normocephalic and atraumatic.     Mouth/Throat:     Mouth: Mucous membranes are moist.  Eyes:     Pupils: Pupils are equal, round, and reactive to light.  Cardiovascular:     Rate and Rhythm: Normal rate.     Pulses: Normal pulses.          Radial pulses are 2+ on the right side and 2+ on the left side.       Posterior tibial pulses are 2+ on the right side and 2+ on the left side.     Heart sounds: Normal heart sounds.  Pulmonary:     Effort: Pulmonary effort is normal. No respiratory distress.     Breath sounds: Normal breath sounds.  Abdominal:     General: Bowel sounds  are normal. There is no distension.  Musculoskeletal:        General: Normal range of motion.     Cervical back: Normal range of motion.     Comments: No bony tenderness, compartments soft.  No lower extremity edema, Homans' sign negative  Skin:    General: Skin is warm and dry.     Capillary Refill: Capillary refill takes less than 2 seconds.  Neurological:     General: No focal deficit present.     Mental Status: She is alert and oriented to person, place, and time.  Psychiatric:        Mood and Affect: Mood normal.    ED Results / Procedures / Treatments   Labs (all labs ordered are listed, but only abnormal results are displayed) Labs Reviewed  BASIC METABOLIC PANEL - Abnormal; Notable for the following components:      Result Value   Glucose, Bld 109 (*)    All other components within normal limits  CBC - Abnormal; Notable for the following components:   Hemoglobin 15.1 (*)    All other components within normal limits  POC URINE PREG, ED  TROPONIN I (HIGH SENSITIVITY)  TROPONIN I (HIGH SENSITIVITY)    EKG None  Radiology No results found.  Procedures Procedures   Medications Ordered in ED Medications - No data to display  ED Course  I have reviewed the triage vital signs and the nursing notes.  Pertinent labs & imaging results that were available during my care of the patient were reviewed by me and considered in my medical decision making (see chart for details).  55 year old here for evaluation of palpitations.  She is afebrile, nonseptic, not ill-appearing.  Symptoms started earlier today, resolved prior to my evaluation.  Something similar 1 month ago, thought due to low potassium.  She has been supplementing at home.  She does not appear grossly fluid overloaded.  She is Wells criteria low risk.  She has no chest pain, shortness of breath, lower extremity edema.  No obvious VTE on exam.  She is currently asymptomatic.  Her heart and lungs are clear.  Abdomen  soft, nontender.  Work-up started from triage which I personally reviewed and interpreted:  CBC without leukocytosis Metabolic panel with glucose 109 Trop <2,2 EKG without ischemia, arrhythmia Chest x-ray> declined  Patient currently asymptomatic, she denies any current complaints.  Unclear etiology of patient's earlier symptoms given there currently resolved prior to my evaluation.  Encourage calling PCP/cardiology to let them know she was seen here  today, could possibly benefit from Holter monitor.  Return for any new or worsening symptoms.  She is agreeable.  At this time I have low suspicion for acute ACS, PE, dissection, bacterial infectious process at this time.  The patient has been appropriately medically screened and/or stabilized in the ED. I have low suspicion for any other emergent medical condition which would require further screening, evaluation or treatment in the ED or require inpatient management.  Patient is hemodynamically stable and in no acute distress.  Patient able to ambulate in department prior to ED.  Evaluation does not show acute pathology that would require ongoing or additional emergent interventions while in the emergency department or further inpatient treatment.  I have discussed the diagnosis with the patient and answered all questions.  Pain is been managed while in the emergency department and patient has no further complaints prior to discharge.  Patient is comfortable with plan discussed in room and is stable for discharge at this time.  I have discussed strict return precautions for returning to the emergency department.  Patient was encouraged to follow-up with PCP/specialist refer to at discharge.    MDM Rules/Calculators/A&P                             Final Clinical Impression(s) / ED Diagnoses Final diagnoses:  Palpitations    Rx / DC Orders ED Discharge Orders     None        Daxton Nydam A, PA-C 09/28/21 2158    Milton Ferguson,  MD 09/29/21 1111

## 2021-09-28 NOTE — ED Triage Notes (Signed)
Pt reports since this morning has been noticing "long pauses" in her heart rate.  Says has had palpitations approx 1 month ago and her potassium was low.  Says followed up with cardiology approx 1 month ago and once she started taking potassium, the palpitations stopped.

## 2021-09-28 NOTE — Discharge Instructions (Signed)
Your work-up today was reassuring  I would recommend calling your primary care doctor or cardiologist within know you are seen here today.  Return for new or worsening symptoms

## 2021-11-06 ENCOUNTER — Ambulatory Visit: Payer: 59 | Admitting: Cardiology

## 2021-11-17 ENCOUNTER — Ambulatory Visit (INDEPENDENT_AMBULATORY_CARE_PROVIDER_SITE_OTHER): Payer: 59 | Admitting: Cardiology

## 2021-11-17 ENCOUNTER — Other Ambulatory Visit: Payer: Self-pay

## 2021-11-17 ENCOUNTER — Encounter: Payer: Self-pay | Admitting: Cardiology

## 2021-11-17 VITALS — BP 108/62 | HR 93 | Ht 67.0 in | Wt 157.6 lb

## 2021-11-17 DIAGNOSIS — R002 Palpitations: Secondary | ICD-10-CM | POA: Diagnosis not present

## 2021-11-17 NOTE — Patient Instructions (Signed)

## 2021-11-17 NOTE — Progress Notes (Signed)
Clinical Summary Sheila Lawrence is a 56 y.o.female seen today for follow up of the following medical problems.   1.Palpitations - long history of palpitations, often worst with stress - more recently symptoms have changed. Feeling of heart skipping and pausing, would check pulse and notices irregular rhythm - symptosm are sporadic, last episode over 3 weeks ago. Had been going on more often, could last all day - at times could feel ligtheaded, though infrequent. - no coffee, sun drops cans/bottles total of 3 per day or more, no energy drinks     - in December episdoe of feeling like heart was pausing, seen in ER.  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     potassium chloride (KLOR-CON) 10 MEQ tablet Take 4 tablets (40 mEq total) by mouth daily. 16 tablet 0   rosuvastatin (CRESTOR) 5 MG tablet Take 5 mg by mouth daily.     No current facility-administered medications for this visit.     Past Surgical History:  Procedure Laterality Date   COLONOSCOPY N/A 08/22/2019   Procedure: COLONOSCOPY;  Surgeon: Rogene Houston, MD;  [facility];  Service: Endoscopy;  Laterality: N/A;  [place]     POLYPECTOMY  08/22/2019   Procedure: POLYPECTOMY;  Surgeon: Rogene Houston, MD;  [facility];  Service: Endoscopy;;  rectum    TRIGGER FINGER RELEASE Right      No Known Allergies    Family History  Problem Relation Age of Onset   Asthma Mother      Social History Ms. Bedore reports that she has been smoking cigarettes. She has been smoking an average of 1 pack per day. She has never used smokeless tobacco. Ms. Siddoway reports that she  does not currently use alcohol.   Review of Systems CONSTITUTIONAL: No weight loss, fever, chills, weakness or fatigue.  HEENT: Eyes: No visual loss, blurred vision, double vision or yellow sclerae.No hearing loss, sneezing, congestion, runny nose or sore throat.  SKIN: No rash or itching.  CARDIOVASCULAR: per hpi RESPIRATORY: No shortness of breath, cough or sputum.  GASTROINTESTINAL: No anorexia, nausea, vomiting or diarrhea. No abdominal pain or blood.  GENITOURINARY: No burning on urination, no polyuria NEUROLOGICAL: No headache, dizziness, syncope, paralysis, ataxia, numbness or tingling in the extremities. No change in bowel or bladder control.  MUSCULOSKELETAL: No muscle, back pain, joint pain or stiffness.  LYMPHATICS: No enlarged nodes. No history of splenectomy.  PSYCHIATRIC: No history of depression or anxiety.  ENDOCRINOLOGIC: No reports of sweating, cold or heat intolerance. No polyuria or polydipsia.  Marland Kitchen   Physical Examination Today's Vitals   11/17/21 1454  BP: 108/62  Pulse: 93  SpO2: 97%  Weight: 157 lb 9.6 oz (71.5 kg)  Height: 5\' 7"  (1.702 m)   Body mass index is 24.68 kg/m.  Gen: resting comfortably, no acute distress HEENT: no scleral icterus, pupils equal round and reactive, no palptable cervical adenopathy,  CV: RRR, no m/rg no jvd Resp: Clear to auscultation bilaterally GI: abdomen is soft, non-tender, non-distended, normal bowel sounds, no hepatosplenomegaly MSK: extremities are warm,  no edema.  Skin: warm, no rash Neuro:  no focal deficits Psych: appropriate affect   Diagnostic Studies     Assessment and Plan  Palpitations - sporadic episodes, non since Christmas time - continue to observe at this time, if recurrent episodes would plan for outpatient monitor   F/u 1 year      [name], M.D.

## 2021-12-09 DIAGNOSIS — E7849 Other hyperlipidemia: Secondary | ICD-10-CM | POA: Diagnosis not present

## 2021-12-09 DIAGNOSIS — F418 Other specified anxiety disorders: Secondary | ICD-10-CM | POA: Diagnosis not present

## 2021-12-09 DIAGNOSIS — F329 Major depressive disorder, single episode, unspecified: Secondary | ICD-10-CM | POA: Diagnosis not present

## 2021-12-09 DIAGNOSIS — E663 Overweight: Secondary | ICD-10-CM | POA: Diagnosis not present

## 2021-12-09 DIAGNOSIS — Z0000 Encounter for general adult medical examination without abnormal findings: Secondary | ICD-10-CM | POA: Diagnosis not present

## 2021-12-09 DIAGNOSIS — E782 Mixed hyperlipidemia: Secondary | ICD-10-CM | POA: Diagnosis not present

## 2021-12-09 DIAGNOSIS — R002 Palpitations: Secondary | ICD-10-CM | POA: Diagnosis not present

## 2021-12-09 DIAGNOSIS — Z1331 Encounter for screening for depression: Secondary | ICD-10-CM | POA: Diagnosis not present

## 2021-12-09 DIAGNOSIS — Z6825 Body mass index (BMI) 25.0-25.9, adult: Secondary | ICD-10-CM | POA: Diagnosis not present

## 2023-03-08 ENCOUNTER — Other Ambulatory Visit: Payer: Self-pay

## 2023-03-08 ENCOUNTER — Emergency Department (HOSPITAL_COMMUNITY)
Admission: EM | Admit: 2023-03-08 | Discharge: 2023-03-09 | Disposition: A | Discharge status: Home / Self Care | Payer: Commercial Managed Care - PPO | Attending: Emergency Medicine | Admitting: Emergency Medicine

## 2023-03-08 ENCOUNTER — Emergency Department (HOSPITAL_COMMUNITY): Payer: Commercial Managed Care - PPO

## 2023-03-08 ENCOUNTER — Encounter (HOSPITAL_COMMUNITY): Payer: Self-pay | Admitting: Emergency Medicine

## 2023-03-08 DIAGNOSIS — R42 Dizziness and giddiness: Secondary | ICD-10-CM | POA: Diagnosis not present

## 2023-03-08 DIAGNOSIS — R002 Palpitations: Secondary | ICD-10-CM

## 2023-03-08 LAB — CBC
HCT: 46.6 % — ABNORMAL HIGH (ref 36.0–46.0)
Hemoglobin: 16 g/dL — ABNORMAL HIGH (ref 12.0–15.0)
MCH: 32.1 pg (ref 26.0–34.0)
MCHC: 34.3 g/dL (ref 30.0–36.0)
MCV: 93.4 fL (ref 80.0–100.0)
Platelets: 368 10*3/uL (ref 150–400)
RBC: 4.99 MIL/uL (ref 3.87–5.11)
RDW: 12.4 % (ref 11.5–15.5)
WBC: 15.3 10*3/uL — ABNORMAL HIGH (ref 4.0–10.5)
nRBC: 0 % (ref 0.0–0.2)

## 2023-03-08 LAB — BASIC METABOLIC PANEL
Anion gap: 15 (ref 5–15)
BUN: 12 mg/dL (ref 6–20)
CO2: 24 mmol/L (ref 22–32)
Calcium: 9.5 mg/dL (ref 8.9–10.3)
Chloride: 101 mmol/L (ref 98–111)
Creatinine, Ser: 0.93 mg/dL (ref 0.44–1.00)
GFR, Estimated: >60 mL/min (ref >60)
Glucose, Bld: 112 mg/dL — ABNORMAL HIGH (ref 70–99)
Potassium: 3.1 mmol/L — ABNORMAL LOW (ref 3.5–5.1)
Sodium: 140 mmol/L (ref 135–145)

## 2023-03-08 LAB — CBG MONITORING, ED: Glucose-Capillary: 155 mg/dL — ABNORMAL HIGH (ref 70–99)

## 2023-03-08 NOTE — ED Triage Notes (Signed)
Pt c/o dizziness. She went to her doctor for eval of sciatica on Thursday and received rx for steroid taper. Earlier today she felt lightheaded and said her HR was as high as 195 at home. She took another 4 steroid tabs at about 5pm and started feeling dizzy and lightheaded again. HR 124 in triage. No other PMH per pt report.

## 2023-03-09 NOTE — Discharge Instructions (Signed)
Follow-up with your primary doctor and return to the ER if symptoms significantly worsen or change.

## 2023-03-09 NOTE — ED Provider Notes (Signed)
[place] EMERGENCY DEPARTMENT AT [facility] Provider Note   CSN: [id number] Arrival date & time: 03/08/23  1858     History  Chief Complaint  Patient presents with   Dizziness   Palpitations    Sheila Lawrence is a 57 y.o. female.  Patient is a 57 year old female presenting with complaints of palpitations.  She reports running a yard sale over the weekend in the heat and moving objects and items around.  She also reports being started on prednisone several days ago for what was felt to be sciatica.  Shortly after taking her prednisone this evening, she developed palpitations and heart racing.  She states that her heart monitor told her her rate was 192.  This lasted for several minutes, then seem to improve.  By the time she came here the palpitations were gone.  She has had similar episodes in the past, but no definitive diagnosis has been made.  At the time of my assessment, patient is symptom-free and feels well.  The history is provided by the patient.       Home Medications Prior to Admission medications   Medication Sig Start Date End Date Taking? Authorizing Provider  Krill Oil 1000 MG CAPS Take 1 capsule by mouth daily at 6 (six) AM.    [provider]  potassium chloride (KLOR-CON) 10 MEQ tablet Take 4 tablets (40 mEq total) by mouth daily. Patient not taking: Reported on 11/17/2021 08/12/21   Antoine Poche, MD  rosuvastatin (CRESTOR) 5 MG tablet Take 5 mg by mouth daily. 11/10/20   [provider]      Allergies    Patient has no known allergies.    Review of Systems   Review of Systems  All other systems reviewed and are negative.   Physical Exam Updated Vital Signs BP 121/82   Pulse 76   Temp 97.9 F (36.6 C) (Oral)   Resp 15   Ht 5\' 7"  (1.702 m)   Wt 72.6 kg   SpO2 94%   BMI 25.06 kg/m  Physical Exam Vitals and nursing note reviewed.  Constitutional:      General: She is not in acute distress.    Appearance: She  is well-developed. She is not diaphoretic.  HENT:     Head: Normocephalic and atraumatic.  Cardiovascular:     Rate and Rhythm: Normal rate and regular rhythm.     Heart sounds: No murmur heard.    No friction rub. No gallop.  Pulmonary:     Effort: Pulmonary effort is normal. No respiratory distress.     Breath sounds: Normal breath sounds. No wheezing.  Abdominal:     General: Bowel sounds are normal. There is no distension.     Palpations: Abdomen is soft.     Tenderness: There is no abdominal tenderness.  Musculoskeletal:        General: Normal range of motion.     Cervical back: Normal range of motion and neck supple.  Skin:    General: Skin is warm and dry.  Neurological:     General: No focal deficit present.     Mental Status: She is alert and oriented to person, place, and time.     ED Results / Procedures / Treatments   Labs (all labs ordered are listed, but only abnormal results are displayed) Labs Reviewed  BASIC METABOLIC PANEL - Abnormal; Notable for the following components:      Result Value   Potassium 3.1 (*)  Glucose, Bld 112 (*)    All other components within normal limits  CBC - Abnormal; Notable for the following components:   WBC 15.3 (*)    Hemoglobin 16.0 (*)    HCT 46.6 (*)    All other components within normal limits  CBG MONITORING, ED - Abnormal; Notable for the following components:   Glucose-Capillary 155 (*)    All other components within normal limits    EKG EKG Interpretation  Date/Time:  Tuesday Mar 08 2023 19:48:13 EDT Ventricular Rate:  110 PR Interval:  134 QRS Duration: 72 QT Interval:  326 QTC Calculation: 441 R Axis:   92 Text Interpretation: Sinus tachycardia Rightward axis Cannot rule out Anterior infarct , age undetermined Abnormal ECG When compared with ECG of 28-Sep-2021 17:59, increased rate now Confirmed by Meridee Score [phone]) on 03/08/2023 7:51:15 PM  Radiology DG Chest Port 1 View  Result Date:  03/08/2023 CLINICAL DATA:  5107. Atrial fibrillation and dizziness. Subjective tachycardia. EXAM: PORTABLE CHEST 1 VIEW COMPARISON:  PA and lateral 08/11/2021. FINDINGS: Heart size and vasculature are normal. The mediastinum is normally outlined. There is minimal calcification in the aortic arch. There are mild chronic interstitial changes of the lungs but no focal pneumonia. No substantial pleural effusion. There are no acute osseous findings. IMPRESSION: 1. No evidence of acute chest disease. 2. Mild chronic interstitial changes are stable. 3. Aortic atherosclerosis. Electronically Signed   By: Almira Bar M.D.   On: 03/08/2023 20:25    Procedures Procedures    Medications Ordered in ED Medications - No data to display  ED Course/ Medical Decision Making/ A&P  Patient presenting with palpitations as described in the HPI.  Patient arrives here with stable vital signs and is clinically well-appearing.  Physical examination basically unremarkable.  Workup initiated including CBC, metabolic panel, both of which are basically unremarkable.  Her potassium was 3.1, but electrolytes are otherwise normal.    Chest x-ray shows no acute process.  Patient had a prolonged wait due to patient volume this particular evening, and has not had any further symptoms while here.  Her workup is basically unremarkable and I feel can safely be discharged.  I have advised her to follow-up with primary doctor/cardiology if she experiences additional episodes to discuss the possibility of Holter monitoring.  Final Clinical Impression(s) / ED Diagnoses Final diagnoses:  None    Rx / DC Orders ED Discharge Orders     None         Geoffery Lyons, MD 03/09/23 [phone]

## 2023-07-28 IMAGING — DX DG CHEST 2V
2 series · 2 of 2 positions shown · non-contrast
Comparison: 12/02/2018

CLINICAL DATA: Irregular heart rate

EXAM:
CHEST - 2 VIEW

[chest pa]
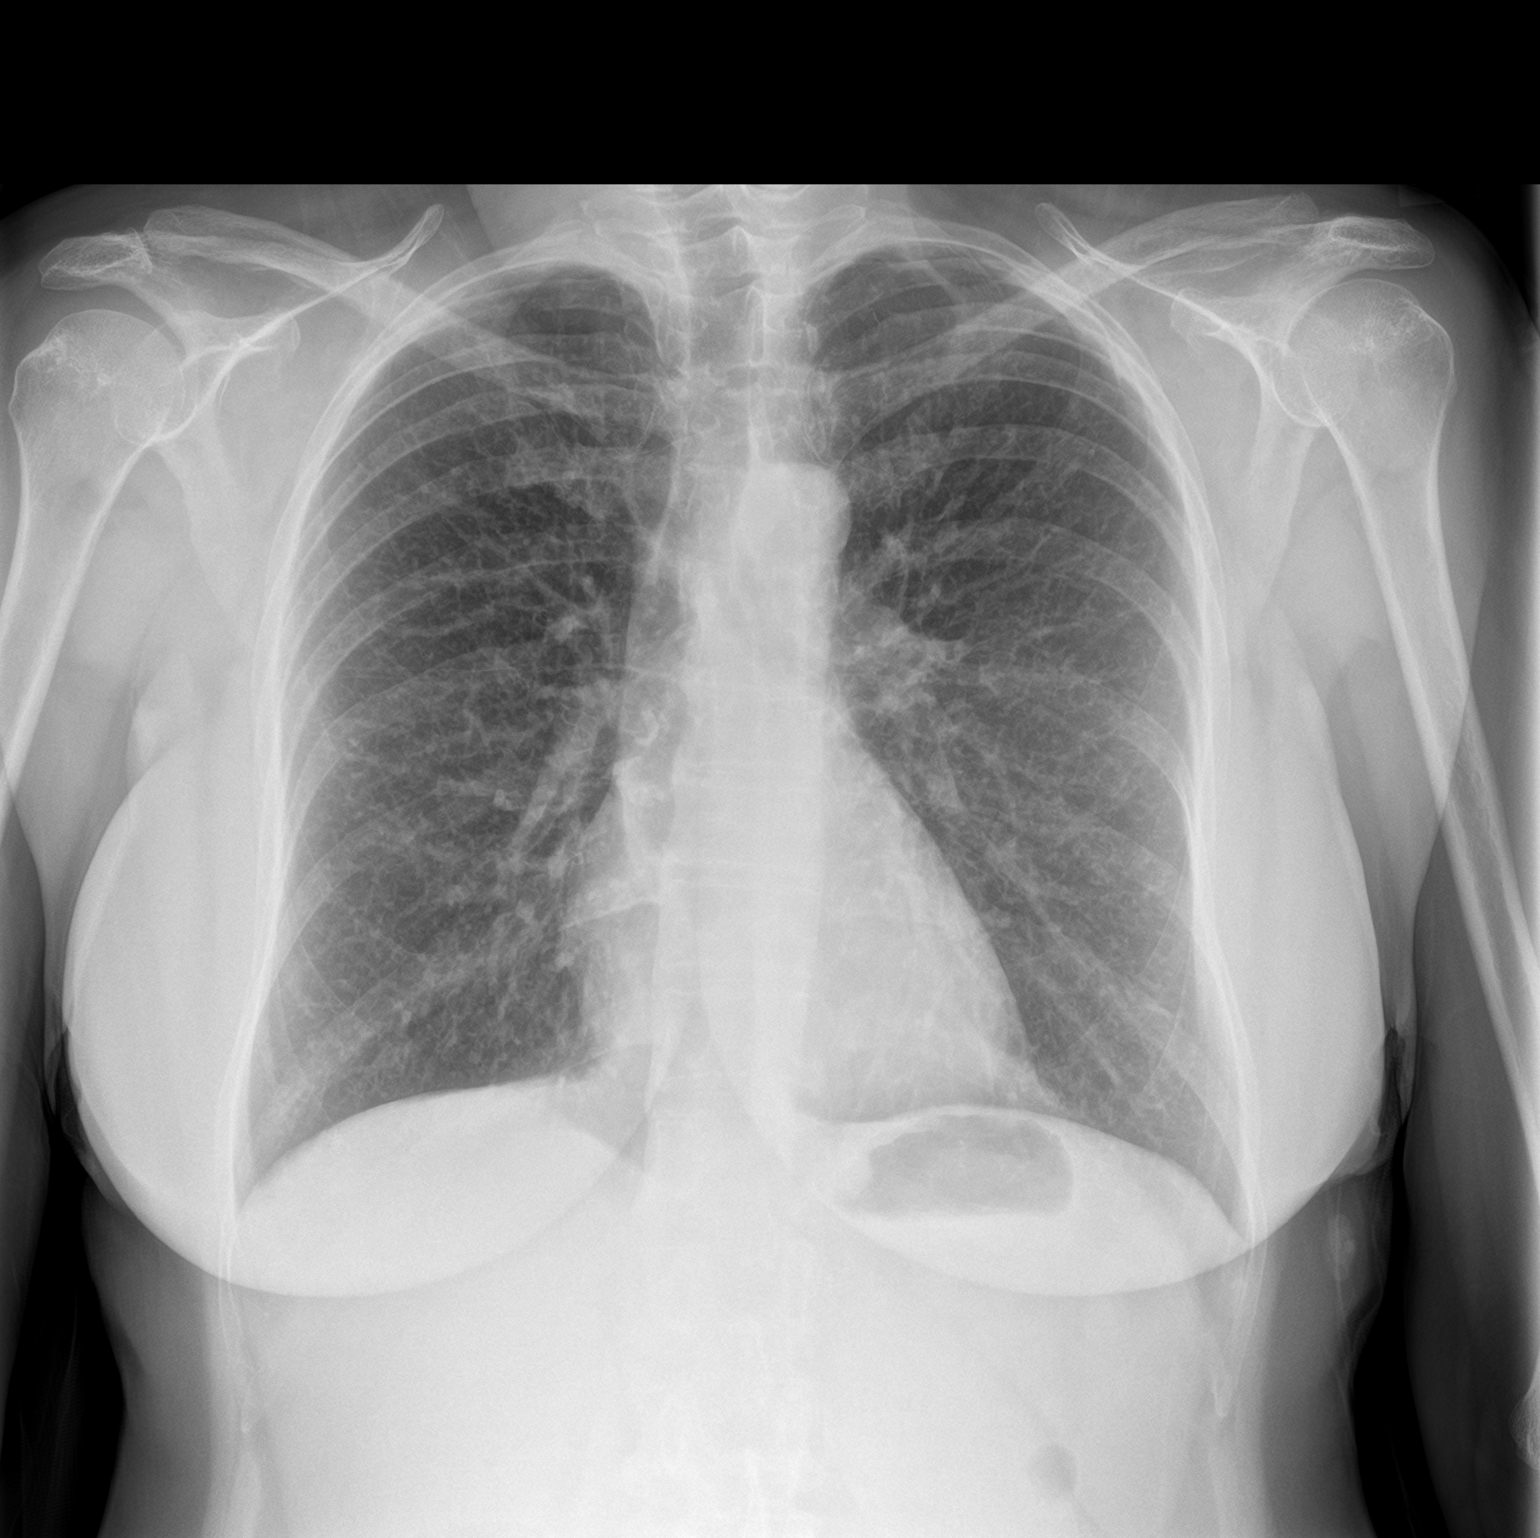

[chest lat]
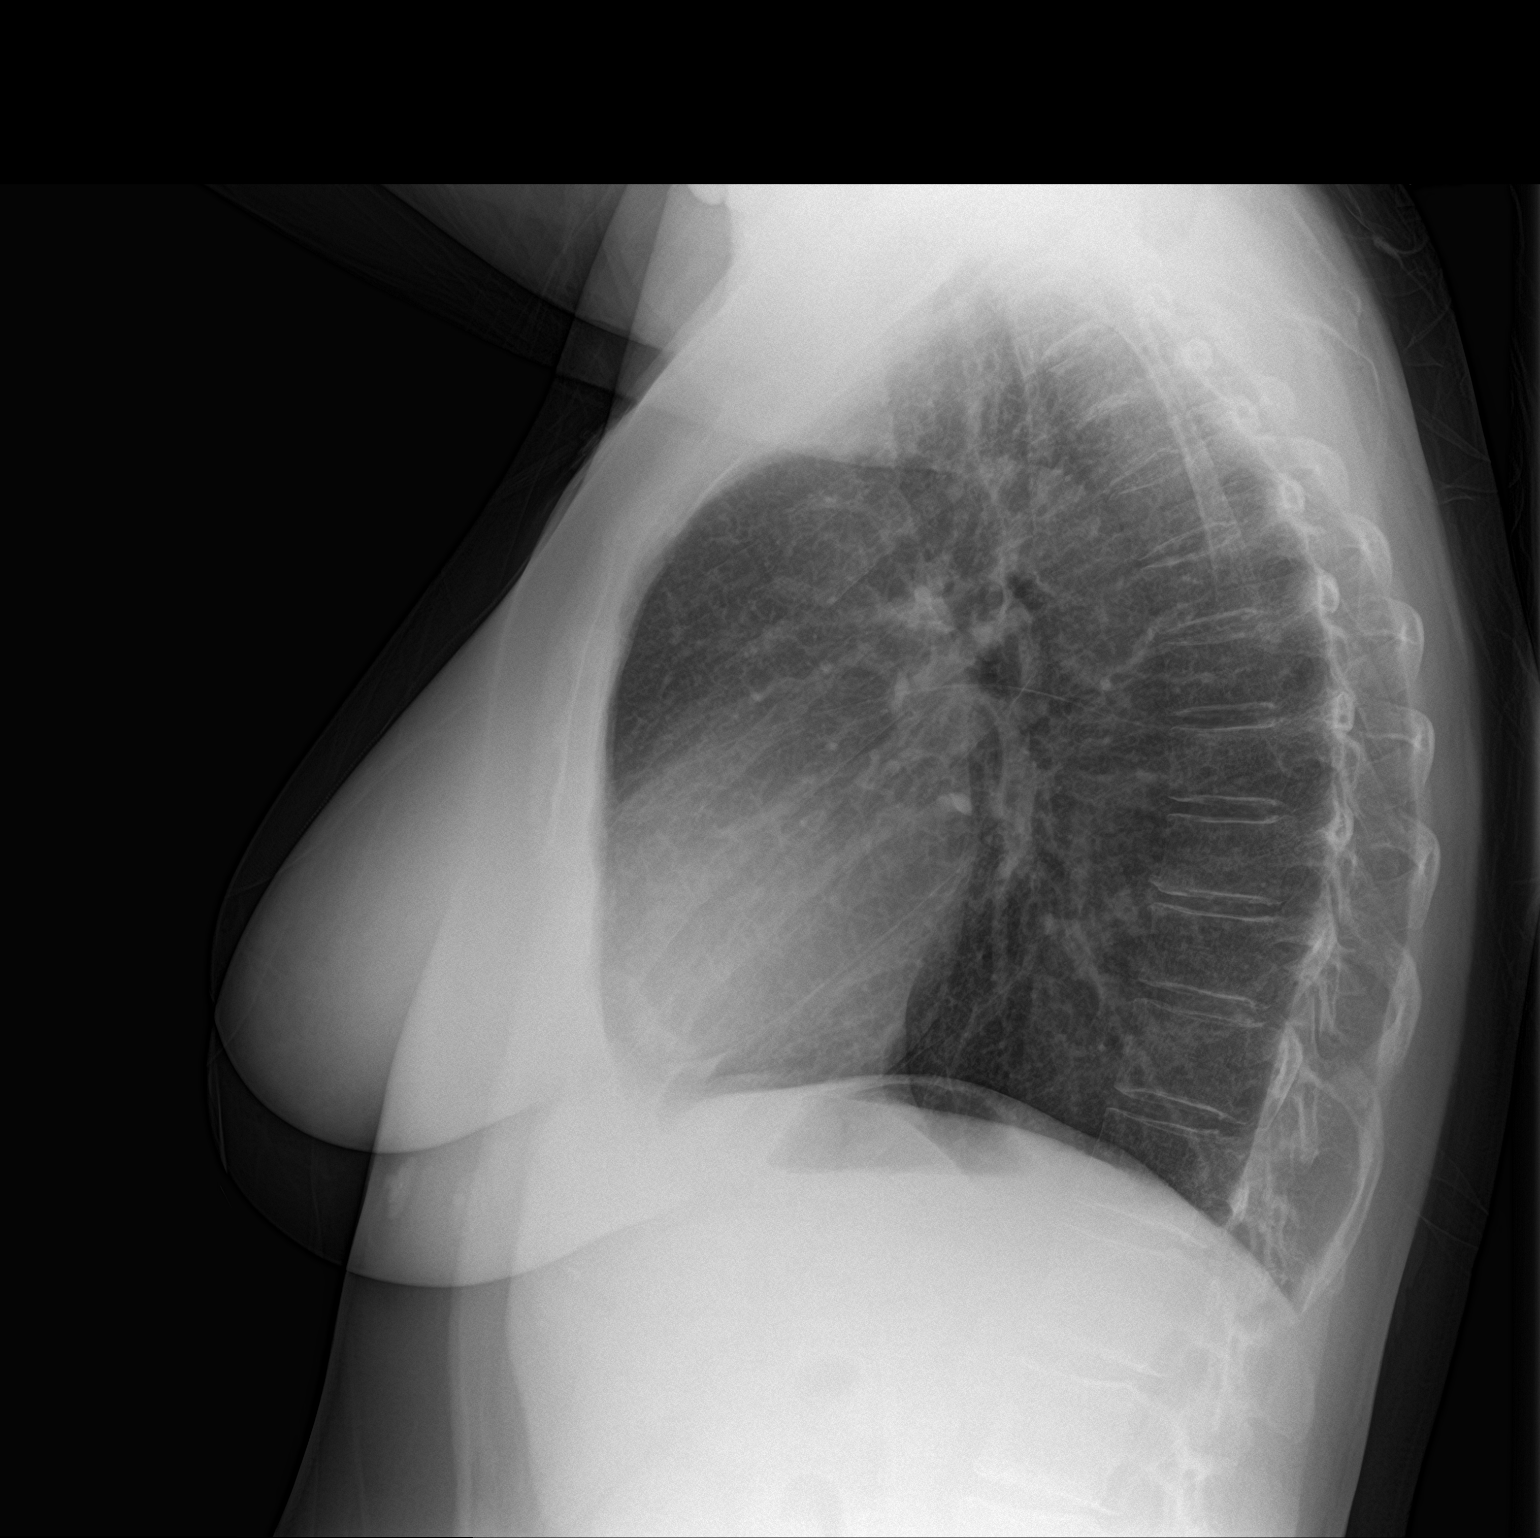

[2 of 2 positions shown; findings below may reference images not displayed]

FINDINGS: Lungs are clear.  No pleural effusion or pneumothorax.

The heart is normal in size.

Visualized osseous structures are within normal limits.
IMPRESSION: Normal chest radiographs.

## 2024-03-13 ENCOUNTER — Encounter (INDEPENDENT_AMBULATORY_CARE_PROVIDER_SITE_OTHER): Payer: Self-pay | Admitting: *Deleted

## 2024-07-13 ENCOUNTER — Encounter (INDEPENDENT_AMBULATORY_CARE_PROVIDER_SITE_OTHER): Payer: Self-pay | Admitting: *Deleted
